# Patient Record
Sex: Male | Born: 2005 | Race: Black or African American | Hispanic: No | Marital: Single | Smoking: Never smoker
Health system: Southern US, Community
[De-identification: ages and names within clinical notes are randomized; demographics above are authoritative.]

---
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
## 2024-05-26 ENCOUNTER — Other Ambulatory Visit: Payer: Self-pay

## 2024-05-26 ENCOUNTER — Emergency Department (HOSPITAL_BASED_OUTPATIENT_CLINIC_OR_DEPARTMENT_OTHER)
Admission: EM | Admit: 2024-05-26 | Discharge: 2024-05-26 | Discharge status: Against Medical Advice | Payer: MEDICAID | Attending: Emergency Medicine | Admitting: Emergency Medicine

## 2024-05-26 ENCOUNTER — Encounter (HOSPITAL_BASED_OUTPATIENT_CLINIC_OR_DEPARTMENT_OTHER): Payer: Self-pay | Admitting: Emergency Medicine

## 2024-05-26 DIAGNOSIS — Z5321 Procedure and treatment not carried out due to patient leaving prior to being seen by health care provider: Secondary | ICD-10-CM | POA: Insufficient documentation

## 2024-05-26 DIAGNOSIS — M545 Low back pain, unspecified: Secondary | ICD-10-CM | POA: Insufficient documentation

## 2024-05-26 DIAGNOSIS — M546 Pain in thoracic spine: Secondary | ICD-10-CM | POA: Diagnosis not present

## 2024-05-26 DIAGNOSIS — M542 Cervicalgia: Secondary | ICD-10-CM | POA: Insufficient documentation

## 2024-05-26 NOTE — ED Notes (Signed)
 Pt is not in his room and no one saw him leave. Charge and PA notified.
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
## 2024-06-17 ENCOUNTER — Emergency Department (HOSPITAL_COMMUNITY)

## 2024-06-17 ENCOUNTER — Emergency Department (HOSPITAL_COMMUNITY)
Admission: EM | Admit: 2024-06-17 | Discharge: 2024-06-17 | Disposition: A | Discharge status: Home / Self Care | Attending: Emergency Medicine | Admitting: Emergency Medicine

## 2024-06-17 DIAGNOSIS — Z23 Encounter for immunization: Secondary | ICD-10-CM | POA: Diagnosis not present

## 2024-06-17 DIAGNOSIS — Y92410 Unspecified street and highway as the place of occurrence of the external cause: Secondary | ICD-10-CM | POA: Insufficient documentation

## 2024-06-17 DIAGNOSIS — S0083XA Contusion of other part of head, initial encounter: Secondary | ICD-10-CM | POA: Diagnosis not present

## 2024-06-17 DIAGNOSIS — R519 Headache, unspecified: Secondary | ICD-10-CM | POA: Diagnosis present

## 2024-06-17 DIAGNOSIS — S060XAA Concussion with loss of consciousness status unknown, initial encounter: Secondary | ICD-10-CM

## 2024-06-17 DIAGNOSIS — S0081XA Abrasion of other part of head, initial encounter: Secondary | ICD-10-CM

## 2024-06-17 DIAGNOSIS — S060X0A Concussion without loss of consciousness, initial encounter: Secondary | ICD-10-CM | POA: Insufficient documentation

## 2024-06-17 DIAGNOSIS — R7401 Elevation of levels of liver transaminase levels: Secondary | ICD-10-CM

## 2024-06-17 LAB — I-STAT CHEM 8, ED
BUN: 13 mg/dL (ref 6–20)
Calcium, Ion: 1.16 mmol/L (ref 1.15–1.40)
Chloride: 102 mmol/L (ref 98–111)
Creatinine, Ser: 0.9 mg/dL (ref 0.61–1.24)
Glucose, Bld: 108 mg/dL — ABNORMAL HIGH (ref 70–99)
HCT: 43 % (ref 39.0–52.0)
Hemoglobin: 14.6 g/dL (ref 13.0–17.0)
Potassium: 3.7 mmol/L (ref 3.5–5.1)
Sodium: 142 mmol/L (ref 135–145)
TCO2: 26 mmol/L (ref 22–32)

## 2024-06-17 LAB — URINALYSIS, ROUTINE W REFLEX MICROSCOPIC
Bilirubin Urine: NEGATIVE
Glucose, UA: NEGATIVE mg/dL
Ketones, ur: NEGATIVE mg/dL
Nitrite: NEGATIVE
Protein, ur: 30 mg/dL — AB
RBC / HPF: >50 RBC/hpf (ref 0–5)
Specific Gravity, Urine: 1.005 (ref 1.005–1.030)
pH: 7 (ref 5.0–8.0)

## 2024-06-17 LAB — COMPREHENSIVE METABOLIC PANEL WITH GFR
ALT: 122 U/L — ABNORMAL HIGH (ref 0–44)
AST: 273 U/L — ABNORMAL HIGH (ref 15–41)
Albumin: 4.3 g/dL (ref 3.5–5.0)
Alkaline Phosphatase: 80 U/L (ref 38–126)
Anion gap: 9 (ref 5–15)
BUN: 13 mg/dL (ref 6–20)
CO2: 28 mmol/L (ref 22–32)
Calcium: 9.3 mg/dL (ref 8.9–10.3)
Chloride: 104 mmol/L (ref 98–111)
Creatinine, Ser: 0.91 mg/dL (ref 0.61–1.24)
GFR, Estimated: >60 mL/min
Glucose, Bld: 106 mg/dL — ABNORMAL HIGH (ref 70–99)
Potassium: 3.7 mmol/L (ref 3.5–5.1)
Sodium: 141 mmol/L (ref 135–145)
Total Bilirubin: 0.5 mg/dL (ref 0.0–1.2)
Total Protein: 7.2 g/dL (ref 6.5–8.1)

## 2024-06-17 LAB — CBC
HCT: 43.6 % (ref 39.0–52.0)
Hemoglobin: 14.3 g/dL (ref 13.0–17.0)
MCH: 28.4 pg (ref 26.0–34.0)
MCHC: 32.8 g/dL (ref 30.0–36.0)
MCV: 86.5 fL (ref 80.0–100.0)
Platelets: 229 K/uL (ref 150–400)
RBC: 5.04 MIL/uL (ref 4.22–5.81)
RDW: 13.6 % (ref 11.5–15.5)
WBC: 11 K/uL — ABNORMAL HIGH (ref 4.0–10.5)
nRBC: 0 % (ref 0.0–0.2)

## 2024-06-17 LAB — I-STAT CG4 LACTIC ACID, ED: Lactic Acid, Venous: 1 mmol/L (ref 0.5–1.9)

## 2024-06-17 LAB — PROTIME-INR
INR: 1 (ref 0.8–1.2)
Prothrombin Time: 13.8 s (ref 11.4–15.2)

## 2024-06-17 LAB — ETHANOL: Alcohol, Ethyl (B): <15 mg/dL

## 2024-06-17 MED ORDER — IBUPROFEN 600 MG PO TABS: 600.0000 mg | ORAL_TABLET | Freq: Four times a day (QID) | ORAL | 0 refills | Status: AC | PRN

## 2024-06-17 MED ORDER — METOCLOPRAMIDE HCL 10 MG PO TABS: 10.0000 mg | ORAL_TABLET | Freq: Four times a day (QID) | ORAL | 0 refills | Status: AC

## 2024-06-17 MED ORDER — LIDOCAINE 5 % EX PTCH: 1.0000 | MEDICATED_PATCH | CUTANEOUS | 0 refills | Status: AC

## 2024-06-17 MED ORDER — TETANUS-DIPHTH-ACELL PERTUSSIS 5-2-15.5 LF-MCG/0.5 IM SUSP
0.5000 mL | Freq: Once | INTRAMUSCULAR | Status: AC
  Administered 2024-06-17: 0.5 mL via INTRAMUSCULAR
  Filled 2024-06-17: qty 0.5

## 2024-06-17 MED ORDER — ACETAMINOPHEN 500 MG PO TABS: 1000.0000 mg | ORAL_TABLET | Freq: Once | ORAL | Status: DC

## 2024-06-17 MED ORDER — METHOCARBAMOL 500 MG PO TABS: 500.0000 mg | ORAL_TABLET | Freq: Two times a day (BID) | ORAL | 0 refills | Status: AC

## 2024-06-17 MED ORDER — BACITRACIN ZINC 500 UNIT/GM EX OINT
TOPICAL_OINTMENT | CUTANEOUS | Status: AC
  Filled 2024-06-17: qty 3.6

## 2024-06-17 MED ORDER — OXYCODONE-ACETAMINOPHEN 5-325 MG PO TABS
2.0000 | ORAL_TABLET | Freq: Once | ORAL | Status: AC
  Administered 2024-06-17: 2 via ORAL
  Filled 2024-06-17: qty 2
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
